# Patient Record
Sex: Female | Born: 1960 | Race: White | Hispanic: No | Marital: Married | Smoking: Former smoker
Health system: Southern US, Community
[De-identification: ages and names within clinical notes are randomized; demographics above are authoritative.]

## PROBLEM LIST (undated history)

## (undated) DIAGNOSIS — I10 Essential (primary) hypertension: Secondary | ICD-10-CM

## (undated) DIAGNOSIS — E119 Type 2 diabetes mellitus without complications: Secondary | ICD-10-CM

## (undated) HISTORY — PX: TONSILLECTOMY: SUR1361

## (undated) HISTORY — PX: FOOT SURGERY: SHX648

## (undated) HISTORY — PX: ABDOMINAL HYSTERECTOMY: SHX81

## (undated) HISTORY — PX: EYE SURGERY: SHX253

---

## 2008-09-22 ENCOUNTER — Ambulatory Visit (HOSPITAL_BASED_OUTPATIENT_CLINIC_OR_DEPARTMENT_OTHER): Admission: RE | Admit: 2008-09-22 | Discharge: 2008-09-22 | Payer: Self-pay | Admitting: Orthopedic Surgery

## 2011-03-27 NOTE — Op Note (Signed)
Christina Mccullough, Christina Mccullough               ACCOUNT NO.:  [id number]   MEDICAL RECORD NO.:  [id number]          PATIENT TYPE:  AMB   LOCATION:  DSC                          FACILITY:  [facility]   PHYSICIAN:  Leonides Grills, M.D.     DATE OF BIRTH:  [birth year]   DATE OF PROCEDURE:  09/22/2008  DATE OF DISCHARGE:                               OPERATIVE REPORT   PREOPERATIVE DIAGNOSES:  1. Left grade 1 posterior tibial tendonitis/insufficiency.  2. Left tight gastroc.   POSTOPERATIVE DIAGNOSES:  1. Left grade 1 posterior tibial tendonitis/insufficiency.  2. Left tight gastroc.   OPERATION:  1. Left gastroc slide.  2. Left posterior tibial tenosynovectomy.  3. Left FDL to navicular tendon transfer.   ANESTHESIA:  General.   SURGEON:  Leonides Grills, MD   ASSISTANT:  Richardean Canal, PA-C   ESTIMATED BLOOD LOSS:  Minimal.   TOURNIQUET TIME:  Approximately an hour.   COMPLICATIONS:  None.   DISPOSITION:  Stable to PR.   INDICATIONS:  This is a 50 year old pleasant female who has had  persistent posteromedial ankle pain secondary to post tibial tendonitis  in resistant to conservative management.  She was consented for the  above procedure.  All risks which include infection or vessel injury,  persistent pain, worsening pain, prolonged recovery were all explained  and questions were encouraged and answered.   OPERATION:  The patient was brought to the operating room and placed in  supine position after adequate general endotracheal anesthesia was  administered as well as Ancef 1 g IV piggyback.  She also had a left  ankle block.  Left lower extremity was then prepped and draped in  sterile manner over a proximally placed thigh tourniquet.  We started  the procedure with a longitudinal incision over the medial aspect of the  gastrocnemius muscle tendinous junction.  Dissection was carried down  through the skin.  Hemostasis was obtained.  Fascia was opened at the  line of the incision.   Conjoined region was then developed between the  gastroc-soleus musculature.  Soft tissue was elevated off the posterior  aspect gastrocnemius.  Sural nerve was identified and protected  posteriorly.  Gastrocnemius was then released with curved Mayo scissors.  This had an excellent release of tight gastroc.  The area was copiously  irrigated with normal saline.  Subcu was closed with 3-0 Vicryl, and the  skin was closed with 4-0 Monocryl, and Steri-Strips were applied.  Limb  was then gravity saline.  Tourniquet was elevated to 290 mmHg.  A  longitudinal incision over midline over the left posterior tibial tendon  was then made.  Dissection was carried down through the skin.  Hemostasis was obtained.  Retinaculum was opened over the posterior  tibial tendon carefully, not to injure the posterior tibial tendon.  There was a large amount of synovitis in this area and formal  tenosynovectomy was then performed.  FDL tendon was then identified and  traced to knot of Henry and then tenotomized.  We then created drill  hole with a 4.5 drill through the navicular tuberosity.  We then  placed  a Swanson suture passer, dorsal to plantar.  We then placed a 2-0  FiberWire fiber loop through the end of the FDL tendon and pulled this  using a Swanson suture passer from plantar to dorsal through the drill  hole.  We then used a free needle to sew the tendon to the posterior  tibial tendon insertion and periosteum locally and sutured the remaining  part of the tendon down distally with 2-0 FiberWire and stitch.  This  had an Conservation officer, historic buildings.  Tourniquet was deflated.  Hemostasis was  obtained.  Retinaculum was closed with 2-0 Vicryl.  Subcu was closed  with 4-0 Monocryl.  Skin was closed with 4-0 nylon.  Sterile dressing  was applied.  Modified Jones dressing was applied.  The ankle and knee  was dorsiflexioned.  The patient was stable to the PR.      Leonides Grills, M.D.  Electronically  Signed     PB/MEDQ  D:  09/22/2008  T:  09/22/2008  Job:  [id number]

## 2011-08-14 LAB — BASIC METABOLIC PANEL
CO2: 29
Calcium: 9
GFR calc Af Amer: >60
Sodium: 140

## 2011-08-14 LAB — POCT HEMOGLOBIN-HEMACUE: Hemoglobin: 11.6 — ABNORMAL LOW

## 2011-08-14 LAB — GLUCOSE, CAPILLARY: Glucose-Capillary: 116 — ABNORMAL HIGH

## 2012-12-15 ENCOUNTER — Emergency Department (HOSPITAL_BASED_OUTPATIENT_CLINIC_OR_DEPARTMENT_OTHER)
Admission: EM | Admit: 2012-12-15 | Discharge: 2012-12-15 | Disposition: A | Discharge status: Home / Self Care | Payer: No Typology Code available for payment source | Attending: Emergency Medicine | Admitting: Emergency Medicine

## 2012-12-15 ENCOUNTER — Emergency Department (HOSPITAL_BASED_OUTPATIENT_CLINIC_OR_DEPARTMENT_OTHER): Payer: No Typology Code available for payment source

## 2012-12-15 ENCOUNTER — Encounter (HOSPITAL_BASED_OUTPATIENT_CLINIC_OR_DEPARTMENT_OTHER): Payer: Self-pay | Admitting: *Deleted

## 2012-12-15 DIAGNOSIS — S4980XA Other specified injuries of shoulder and upper arm, unspecified arm, initial encounter: Secondary | ICD-10-CM | POA: Insufficient documentation

## 2012-12-15 DIAGNOSIS — Z791 Long term (current) use of non-steroidal anti-inflammatories (NSAID): Secondary | ICD-10-CM | POA: Insufficient documentation

## 2012-12-15 DIAGNOSIS — Z794 Long term (current) use of insulin: Secondary | ICD-10-CM | POA: Insufficient documentation

## 2012-12-15 DIAGNOSIS — S139XXA Sprain of joints and ligaments of unspecified parts of neck, initial encounter: Secondary | ICD-10-CM | POA: Insufficient documentation

## 2012-12-15 DIAGNOSIS — E119 Type 2 diabetes mellitus without complications: Secondary | ICD-10-CM | POA: Insufficient documentation

## 2012-12-15 DIAGNOSIS — S161XXA Strain of muscle, fascia and tendon at neck level, initial encounter: Secondary | ICD-10-CM

## 2012-12-15 DIAGNOSIS — I10 Essential (primary) hypertension: Secondary | ICD-10-CM | POA: Insufficient documentation

## 2012-12-15 DIAGNOSIS — F17200 Nicotine dependence, unspecified, uncomplicated: Secondary | ICD-10-CM | POA: Insufficient documentation

## 2012-12-15 DIAGNOSIS — Z79899 Other long term (current) drug therapy: Secondary | ICD-10-CM | POA: Insufficient documentation

## 2012-12-15 DIAGNOSIS — Y9389 Activity, other specified: Secondary | ICD-10-CM | POA: Insufficient documentation

## 2012-12-15 DIAGNOSIS — S46909A Unspecified injury of unspecified muscle, fascia and tendon at shoulder and upper arm level, unspecified arm, initial encounter: Secondary | ICD-10-CM | POA: Insufficient documentation

## 2012-12-15 DIAGNOSIS — Y92410 Unspecified street and highway as the place of occurrence of the external cause: Secondary | ICD-10-CM | POA: Insufficient documentation

## 2012-12-15 HISTORY — DX: Type 2 diabetes mellitus without complications: E11.9

## 2012-12-15 HISTORY — DX: Essential (primary) hypertension: I10

## 2012-12-15 MED ORDER — IBUPROFEN 800 MG PO TABS: 800.0000 mg | ORAL_TABLET | Freq: Three times a day (TID) | ORAL | Status: DC

## 2012-12-15 MED ORDER — HYDROCODONE-ACETAMINOPHEN 5-325 MG PO TABS: 2.0000 | ORAL_TABLET | ORAL | Status: AC | PRN
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
## 2012-12-16 NOTE — ED Provider Notes (Signed)
Medical screening examination/treatment/procedure(s) were performed by non-physician practitioner and as supervising physician I was immediately available for consultation/collaboration.    Zaiya Annunziato, MD 12/16/12 0714 

## 2013-01-07 ENCOUNTER — Emergency Department (HOSPITAL_BASED_OUTPATIENT_CLINIC_OR_DEPARTMENT_OTHER): Payer: No Typology Code available for payment source

## 2013-01-07 ENCOUNTER — Emergency Department (HOSPITAL_BASED_OUTPATIENT_CLINIC_OR_DEPARTMENT_OTHER)
Admission: EM | Admit: 2013-01-07 | Discharge: 2013-01-07 | Disposition: A | Discharge status: Home / Self Care | Payer: No Typology Code available for payment source | Attending: Emergency Medicine | Admitting: Emergency Medicine

## 2013-01-07 ENCOUNTER — Encounter (HOSPITAL_BASED_OUTPATIENT_CLINIC_OR_DEPARTMENT_OTHER): Payer: Self-pay | Admitting: *Deleted

## 2013-01-07 DIAGNOSIS — Z79899 Other long term (current) drug therapy: Secondary | ICD-10-CM | POA: Insufficient documentation

## 2013-01-07 DIAGNOSIS — Z791 Long term (current) use of non-steroidal anti-inflammatories (NSAID): Secondary | ICD-10-CM | POA: Insufficient documentation

## 2013-01-07 DIAGNOSIS — R071 Chest pain on breathing: Secondary | ICD-10-CM | POA: Insufficient documentation

## 2013-01-07 DIAGNOSIS — R0789 Other chest pain: Secondary | ICD-10-CM

## 2013-01-07 DIAGNOSIS — F17200 Nicotine dependence, unspecified, uncomplicated: Secondary | ICD-10-CM | POA: Insufficient documentation

## 2013-01-07 DIAGNOSIS — Z794 Long term (current) use of insulin: Secondary | ICD-10-CM | POA: Insufficient documentation

## 2013-01-07 DIAGNOSIS — E119 Type 2 diabetes mellitus without complications: Secondary | ICD-10-CM | POA: Insufficient documentation

## 2013-01-07 DIAGNOSIS — I10 Essential (primary) hypertension: Secondary | ICD-10-CM | POA: Insufficient documentation

## 2013-01-07 DIAGNOSIS — M542 Cervicalgia: Secondary | ICD-10-CM | POA: Insufficient documentation

## 2013-01-07 DIAGNOSIS — R209 Unspecified disturbances of skin sensation: Secondary | ICD-10-CM | POA: Insufficient documentation

## 2013-01-07 LAB — COMPREHENSIVE METABOLIC PANEL
ALT: 27 U/L (ref 0–35)
AST: 24 U/L (ref 0–37)
Albumin: 3.9 g/dL (ref 3.5–5.2)
Alkaline Phosphatase: 95 U/L (ref 39–117)
Chloride: 100 mEq/L (ref 96–112)
Potassium: 4.1 mEq/L (ref 3.5–5.1)
Sodium: 138 mEq/L (ref 135–145)
Total Bilirubin: 0.2 mg/dL — ABNORMAL LOW (ref 0.3–1.2)

## 2013-01-07 LAB — CBC WITH DIFFERENTIAL/PLATELET
Basophils Absolute: 0.1 10*3/uL (ref 0.0–0.1)
Basophils Relative: 1 % (ref 0–1)
Hemoglobin: 11.5 g/dL — ABNORMAL LOW (ref 12.0–15.0)
MCHC: 32.5 g/dL (ref 30.0–36.0)
Monocytes Relative: 7 % (ref 3–12)
Neutro Abs: 4.3 10*3/uL (ref 1.7–7.7)
Neutrophils Relative %: 54 % (ref 43–77)
Platelets: 282 10*3/uL (ref 150–400)
RDW: 13.8 % (ref 11.5–15.5)

## 2013-01-07 MED ORDER — ASPIRIN 81 MG PO CHEW: 324.0000 mg | CHEWABLE_TABLET | Freq: Once | ORAL | Status: DC

## 2013-01-07 MED ORDER — OXYCODONE-ACETAMINOPHEN 5-325 MG PO TABS
2.0000 | ORAL_TABLET | Freq: Once | ORAL | Status: AC
  Administered 2013-01-07: 2 via ORAL
  Filled 2013-01-07 (×2): qty 2

## 2013-01-07 MED ORDER — ASPIRIN 81 MG PO CHEW
324.0000 mg | CHEWABLE_TABLET | Freq: Once | ORAL | Status: AC
  Administered 2013-01-07: 324 mg via ORAL
  Filled 2013-01-07: qty 4

## 2013-01-07 MED ORDER — OXYCODONE-ACETAMINOPHEN 5-325 MG PO TABS: 1.0000 | ORAL_TABLET | ORAL | Status: DC | PRN
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
## 2013-01-07 NOTE — ED Notes (Signed)
MD at bedside. 
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
## 2015-11-30 ENCOUNTER — Emergency Department (HOSPITAL_COMMUNITY)
Admission: EM | Admit: 2015-11-30 | Discharge: 2015-11-30 | Disposition: A | Discharge status: Home / Self Care | Payer: Worker's Compensation | Attending: Emergency Medicine | Admitting: Emergency Medicine

## 2015-11-30 ENCOUNTER — Encounter (HOSPITAL_COMMUNITY): Payer: Self-pay

## 2015-11-30 ENCOUNTER — Emergency Department (HOSPITAL_COMMUNITY): Payer: Worker's Compensation

## 2015-11-30 DIAGNOSIS — S99912A Unspecified injury of left ankle, initial encounter: Secondary | ICD-10-CM | POA: Diagnosis present

## 2015-11-30 DIAGNOSIS — W010XXA Fall on same level from slipping, tripping and stumbling without subsequent striking against object, initial encounter: Secondary | ICD-10-CM | POA: Insufficient documentation

## 2015-11-30 DIAGNOSIS — Z87891 Personal history of nicotine dependence: Secondary | ICD-10-CM | POA: Insufficient documentation

## 2015-11-30 DIAGNOSIS — E119 Type 2 diabetes mellitus without complications: Secondary | ICD-10-CM | POA: Diagnosis not present

## 2015-11-30 DIAGNOSIS — Z79899 Other long term (current) drug therapy: Secondary | ICD-10-CM | POA: Diagnosis not present

## 2015-11-30 DIAGNOSIS — S82142A Displaced bicondylar fracture of left tibia, initial encounter for closed fracture: Secondary | ICD-10-CM

## 2015-11-30 DIAGNOSIS — S8992XA Unspecified injury of left lower leg, initial encounter: Secondary | ICD-10-CM | POA: Diagnosis not present

## 2015-11-30 DIAGNOSIS — S82832A Other fracture of upper and lower end of left fibula, initial encounter for closed fracture: Secondary | ICD-10-CM | POA: Diagnosis not present

## 2015-11-30 DIAGNOSIS — Y92002 Bathroom of unspecified non-institutional (private) residence single-family (private) house as the place of occurrence of the external cause: Secondary | ICD-10-CM | POA: Diagnosis not present

## 2015-11-30 DIAGNOSIS — Z794 Long term (current) use of insulin: Secondary | ICD-10-CM | POA: Insufficient documentation

## 2015-11-30 DIAGNOSIS — I10 Essential (primary) hypertension: Secondary | ICD-10-CM | POA: Diagnosis not present

## 2015-11-30 DIAGNOSIS — Y9389 Activity, other specified: Secondary | ICD-10-CM | POA: Diagnosis not present

## 2015-11-30 DIAGNOSIS — Y998 Other external cause status: Secondary | ICD-10-CM | POA: Diagnosis not present

## 2015-11-30 DIAGNOSIS — S82402A Unspecified fracture of shaft of left fibula, initial encounter for closed fracture: Secondary | ICD-10-CM

## 2015-11-30 MED ORDER — ONDANSETRON HCL 4 MG PO TABS: 4.0000 mg | ORAL_TABLET | Freq: Four times a day (QID) | ORAL | Status: DC

## 2015-11-30 MED ORDER — PROMETHAZINE HCL 25 MG PO TABS: 25.0000 mg | ORAL_TABLET | Freq: Four times a day (QID) | ORAL | Status: AC | PRN

## 2015-11-30 MED ORDER — HYDROMORPHONE HCL 1 MG/ML IJ SOLN
0.5000 mg | Freq: Once | INTRAMUSCULAR | Status: AC
  Administered 2015-11-30: 0.5 mg via INTRAVENOUS
  Filled 2015-11-30: qty 1

## 2015-11-30 MED ORDER — OXYCODONE-ACETAMINOPHEN 5-325 MG PO TABS
1.0000 | ORAL_TABLET | Freq: Once | ORAL | Status: AC
  Administered 2015-11-30: 1 via ORAL
  Filled 2015-11-30: qty 1

## 2015-11-30 MED ORDER — OXYCODONE-ACETAMINOPHEN 5-325 MG PO TABS: 1.0000 | ORAL_TABLET | ORAL | Status: AC | PRN

## 2015-11-30 NOTE — ED Notes (Signed)
Patient transported to X-ray 

## 2015-11-30 NOTE — Discharge Instructions (Signed)
KEEP THE LEFT LEG ELEVATED AS MUCH AS POSSIBLE - THIS MEANS PROP THE LEG UP ON PILLOWS AND RECLINE SO THAT THE LEG IS ABOVE HEART LEVEL. ICE AS INSTRUCTED TO REDUCE SWELLING. FOLLOW UP WITH DR. Veda Canning AS PLANNED AND TAKE MEDICATION FOR PAIN AS NEEDED, WITH ZOFRAN FOR ANY NAUSEA. IT IS RECOMMENDED THAT YOU USE A STOOL SOFTENER WHILE TAKING PERCOCET AS NARCOTIC PAIN RELIEVERS CAN CAUSE CONSTIPATION. RETURN TO THE EMERGENCY DEPARTMENT WITH ANY NEW INJURY, OR NEW CONCERN.  Cast or Splint Care Casts and splints support injured limbs and keep bones from moving while they heal. It is important to care for your cast or splint at home.  HOME CARE INSTRUCTIONS  Keep the cast or splint uncovered during the drying period. It can take 24 to 48 hours to dry if it is made of plaster. A fiberglass cast will dry in less than 1 hour.  Do not rest the cast on anything harder than a pillow for the first 24 hours.  Do not put weight on your injured limb or apply pressure to the cast until your health care provider gives you permission.  Keep the cast or splint dry. Wet casts or splints can lose their shape and may not support the limb as well. A wet cast that has lost its shape can also create harmful pressure on your skin when it dries. Also, wet skin can become infected.  Cover the cast or splint with a plastic bag when bathing or when out in the rain or snow. If the cast is on the trunk of the body, take sponge baths until the cast is removed.  If your cast does become wet, dry it with a towel or a blow dryer on the cool setting only.  Keep your cast or splint clean. Soiled casts may be wiped with a moistened cloth.  Do not place any hard or soft foreign objects under your cast or splint, such as cotton, toilet paper, lotion, or powder.  Do not try to scratch the skin under the cast with any object. The object could get stuck inside the cast. Also, scratching could lead to an infection. If itching is a  problem, use a blow dryer on a cool setting to relieve discomfort.  Do not trim or cut your cast or remove padding from inside of it.  Exercise all joints next to the injury that are not immobilized by the cast or splint. For example, if you have a long leg cast, exercise the hip joint and toes. If you have an arm cast or splint, exercise the shoulder, elbow, thumb, and fingers.  Elevate your injured arm or leg on 1 or 2 pillows for the first 1 to 3 days to decrease swelling and pain.It is best if you can comfortably elevate your cast so it is higher than your heart. SEEK MEDICAL CARE IF:   Your cast or splint cracks.  Your cast or splint is too tight or too loose.  You have unbearable itching inside the cast.  Your cast becomes wet or develops a soft spot or area.  You have a bad smell coming from inside your cast.  You get an object stuck under your cast.  Your skin around the cast becomes red or raw.  You have new pain or worsening pain after the cast has been applied. SEEK IMMEDIATE MEDICAL CARE IF:   You have fluid leaking through the cast.  You are unable to move your fingers or toes.  You have discolored (blue or white), cool, painful, or very swollen fingers or toes beyond the cast.  You have tingling or numbness around the injured area.  You have severe pain or pressure under the cast.  You have any difficulty with your breathing or have shortness of breath.  You have chest pain.   This information is not intended to replace advice given to you by your health care provider. Make sure you discuss any questions you have with your health care provider.   Document Released: 10/26/2000 Document Revised: 08/19/2013 Document Reviewed: 05/07/2013 Elsevier Interactive Patient Education 2016 [employer]  Fibular Ankle Fracture Treated With or Without Immobilization, Adult A fibular fracture at your ankle is a break (fracture) bone in the smallest of the two bones in  your lower leg, located on the outside of your leg (fibula) close to the area at your ankle joint. CAUSES  Rolling your ankle.  Twisting your ankle.  Extreme flexing or extending of your foot.  Severe force on your ankle as when falling from a distance. RISK FACTORS  Jumping activities.  Participation in sports.  Osteoporosis.  Advanced age.  Previous ankle injuries. SIGNS AND SYMPTOMS  Pain.  Swelling.  Inability to put weight on injured ankle.  Bruising.  Bone deformities at site of injury. DIAGNOSIS  This fracture is diagnosed with the help of an X-ray exam. TREATMENT  If the fractured bone did not move out of place it usually will heal without problems and does casting or splinting. If immobilization is needed for comfort or the fractured bone moved out of place and will not heal properly with immobilization, a cast or splint will be used. HOME CARE INSTRUCTIONS   Apply ice to the area of injury:  Put ice in a plastic bag.  Place a towel between your skin and the bag.  Leave the ice on for 20 minutes, 2-3 times a day.  Use crutches as directed. Resume walking without crutches as directed by your health care provider.  Only take over-the-counter or prescription medicines for pain, discomfort, or fever as directed by your health care provider.  If you have a removable splint or boot, do not remove the boot unless directed by your health care provider. SEEK MEDICAL CARE IF:   You have continued pain or more swelling  The medications do not control the pain. SEEK IMMEDIATE MEDICAL CARE IF:  You develop severe pain in the leg or foot.  Your skin or nails below the injury turn blue or grey or feel cold or numb. MAKE SURE YOU:   Understand these instructions.  Will watch your condition.  Will get help right away if you are not doing well or get worse.   This information is not intended to replace advice given to you by your health care provider. Make  sure you discuss any questions you have with your health care provider.   Document Released: 10/29/2005 Document Revised: 11/19/2014 Document Reviewed: 06/10/2013 Elsevier Interactive Patient Education 2016 [employer] Nondisplaced Tibial Plateau Fracture A tibial plateau fracture is a break in the bone that forms the bottom of your knee joint (tibia or shin bone). The lower end of your thigh bone (femur) forms the upper surface of your knee joint. The top of the tibia has a flat, smooth surface (tibial plateau). This part of the shin bone is made of softer bone than the shaft of the shin bone. If a strong force shoves your femur down into your tibial plateau,  the tibial plateau can collapse or break away at the edges. This is also called an intra-articular fracture. A nondisplaced fracture means that the broken piece or pieces of your tibial plateau have not moved out of their normal position. This type of fracture can usually be treated without surgery. You will need to wear a brace or a splint and avoid using your knee to support your body weight while the fracture is healing. CAUSES Common causes of this type of fracture include:  Car accidents.  Jumps or falls from a significant height.  Injuries from high-energy sports or contact sports. RISK FACTORS You may be at higher risk for this type of fracture if:  You play high-energy sports or contact sports.  You have a history of bone infections.  You are an older person with a condition that causes weak bones (osteoporosis). SIGNS AND SYMPTOMS Signs and symptoms of a nondisplaced tibial plateau fracture begin immediately after the injury. They may include:  Pain that is worse when you use your knee to support your body weight.  Swelling of your knee.  Bruising around your knee. DIAGNOSIS Your health care provider may suspect a nondisplaced tibial plateau fracture based on your signs and symptoms, especially if you had a recent  injury. Your health care provider will also do a physical exam. This may include imaging tests such as:  X-ray of your knee. This is used to confirm the diagnosis.  CT scan or MRI. This checks to make sure that no bones have moved out of place and that there are no other injuries to your knee. TREATMENT Treatment for a nondisplaced tibial plateau fracture may involve wearing a brace or a splint to keep your leg in one position while it heals. You may also need to use crutches, a scooter, a walker, or a wheelchair so you can move around without using your injured leg to support your body weight. You may also be prescribed pain medicine. While your knee is healing, you may see a physical therapist. Your physical therapist will move your knee without putting weight on it (passive exercise). This helps to keep your knee from becoming stiff. After your leg has healed, your health care provider will show you how to do range-of-motion exercises to strengthen your knee muscles and prevent stiffness. HOME CARE INSTRUCTIONS If You Have a Brace or a Splint:  Wear it as directed by your health care provider. Remove it only as directed by your health care provider.  Loosen the brace or splint if your toes become numb and tingle, or if they turn cold and blue. Bathing  Cover the brace or splint with a watertight plastic bag to protect it from water while you bathe or shower. Do not let the brace or splint get wet. Managing Pain, Stiffness, and Swelling  If directed, apply ice to the injured area:  Put ice in a plastic bag.  Place a towel between your skin and the bag.  Leave the ice on for 20 minutes, 2-3 times a day.  Move your toes and ankle often to avoid stiffness and to lessen swelling.  Raise the injured area above the level of your heart while you are sitting or lying down. Driving  Do not drive or operate heavy machinery while taking pain medicine.  Do not drive while wearing a brace or a  splint on a leg that you use for driving. Activity  Return to your normal activities as directed by your health care provider.  Ask your health care provider what activities are safe for you.  Perform range-of-motion exercises only as directed by your health care provider. Safety  Do not use the injured limb to support your body weight until your health care provider says that you can. Use crutches, a scooter, a walker, or a wheelchair as directed by your health care provider. General Instructions  Keep the brace or splint clean and dry.  Do not use any tobacco products, including cigarettes, chewing tobacco, or electronic cigarettes. Tobacco can delay bone healing. If you need help quitting, ask your health care provider.  Take medicines only as directed by your health care provider.  Keep all follow-up visits as directed by your health care provider. This is important. SEEK MEDICAL CARE IF:  Your pain is getting worse.  Your pain medicine is not helping. SEEK IMMEDIATE MEDICAL CARE IF:  You have very bad pain in your injured leg.  You have swelling or redness in your foot that is getting worse.  You begin to lose feeling in your foot or your toes.  Your foot or your toes are cold or turn blue.   This information is not intended to replace advice given to you by your health care provider. Make sure you discuss any questions you have with your health care provider.   Document Released: 08/08/2005 Document Revised: 11/19/2014 Document Reviewed: 06/16/2014 Elsevier Interactive Patient Education 2016 [employer] Inc. Cryotherapy Cryotherapy means treatment with cold. Ice or gel packs can be used to reduce both pain and swelling. Ice is the most helpful within the first 24 to 48 hours after an injury or flare-up from overusing a muscle or joint. Sprains, strains, spasms, burning pain, shooting pain, and aches can all be eased with ice. Ice can also be used when recovering from surgery.  Ice is effective, has very few side effects, and is safe for most people to use. PRECAUTIONS  Ice is not a safe treatment option for people with:  Raynaud phenomenon. This is a condition affecting small blood vessels in the extremities. Exposure to cold may cause your problems to return.  Cold hypersensitivity. There are many forms of cold hypersensitivity, including:  Cold urticaria. Red, itchy hives appear on the skin when the tissues begin to warm after being iced.  Cold erythema. This is a red, itchy rash caused by exposure to cold.  Cold hemoglobinuria. Red blood cells break down when the tissues begin to warm after being iced. The hemoglobin that carry oxygen are passed into the urine because they cannot combine with blood proteins fast enough.  Numbness or altered sensitivity in the area being iced. If you have any of the following conditions, do not use ice until you have discussed cryotherapy with your caregiver:  Heart conditions, such as arrhythmia, angina, or chronic heart disease.  High blood pressure.  Healing wounds or open skin in the area being iced.  Current infections.  Rheumatoid arthritis.  Poor circulation.  Diabetes. Ice slows the blood flow in the region it is applied. This is beneficial when trying to stop inflamed tissues from spreading irritating chemicals to surrounding tissues. However, if you expose your skin to cold temperatures for too long or without the proper protection, you can damage your skin or nerves. Watch for signs of skin damage due to cold. HOME CARE INSTRUCTIONS Follow these tips to use ice and cold packs safely.  Place a dry or damp towel between the ice and skin. A damp towel will cool the  skin more quickly, so you may need to shorten the time that the ice is used.  For a more rapid response, add gentle compression to the ice.  Ice for no more than 10 to 20 minutes at a time. The bonier the area you are icing, the less time it will  take to get the benefits of ice.  Check your skin after 5 minutes to make sure there are no signs of a poor response to cold or skin damage.  Rest 20 minutes or more between uses.  Once your skin is numb, you can end your treatment. You can test numbness by very lightly touching your skin. The touch should be so light that you do not see the skin dimple from the pressure of your fingertip. When using ice, most people will feel these normal sensations in this order: cold, burning, aching, and numbness.  Do not use ice on someone who cannot communicate their responses to pain, such as small children or people with dementia. HOW TO MAKE AN ICE PACK Ice packs are the most common way to use ice therapy. Other methods include ice massage, ice baths, and cryosprays. Muscle creams that cause a cold, tingly feeling do not offer the same benefits that ice offers and should not be used as a substitute unless recommended by your caregiver. To make an ice pack, do one of the following:  Place crushed ice or a bag of frozen vegetables in a sealable plastic bag. Squeeze out the excess air. Place this bag inside another plastic bag. Slide the bag into a pillowcase or place a damp towel between your skin and the bag.  Mix 3 parts water with 1 part rubbing alcohol. Freeze the mixture in a sealable plastic bag. When you remove the mixture from the freezer, it will be slushy. Squeeze out the excess air. Place this bag inside another plastic bag. Slide the bag into a pillowcase or place a damp towel between your skin and the bag. SEEK MEDICAL CARE IF:  You develop white spots on your skin. This may give the skin a blotchy (mottled) appearance.  Your skin turns blue or pale.  Your skin becomes waxy or hard.  Your swelling gets worse. MAKE SURE YOU:   Understand these instructions.  Will watch your condition.  Will get help right away if you are not doing well or get worse.   This information is not  intended to replace advice given to you by your health care provider. Make sure you discuss any questions you have with your health care provider.   Document Released: 06/25/2011 Document Revised: 11/19/2014 Document Reviewed: 06/25/2011 Elsevier Interactive Patient Education [employer]

## 2015-11-30 NOTE — ED Notes (Addendum)
PT RETURNED FROM CT. PT PLACED ON A STRETCHER FOR COMFORT.

## 2015-11-30 NOTE — ED Notes (Signed)
PT DISCHARGED. INSTRUCTIONS AND PRESCRIPTIONS GIVEN. AAOX3. PT IN NO APPARENT DISTRESS. THE OPPORTUNITY TO ASK QUESTIONS WAS PROVIDED. [facility]AR ARRIVED TO TRANSPORT PT HOME.

## 2015-11-30 NOTE — ED Notes (Signed)
Patient transported to CT 

## 2015-11-30 NOTE — ED Notes (Signed)
ORTHO TECH AT THE BEDSIDE TO APPLY THE SPLINT. PT TOLERATING WELL.

## 2015-11-30 NOTE — ED Provider Notes (Signed)
The patient was signed out at end of shift by Wynetta Emery, PA-C, with CT pending to evaluate left tibial plateau fracture. Patient has a mechanical fall, falling onto the left leg causing oblique fibula fracture and knee injury. Imaging support slightly depressed tibial plateau fx and fibula fracture, both closed injuries.  Discussed with Dr. Veda Canning of [facility] orthopedics who reviews the films and advises outpatient management. Patient placed in posterior stirrup splint (fibular fx) and knee immobilizer. She is strongly advised to remain non-weight bearing, use ice as instructed and elevate the leg, describing definition of "elevation". She will need to call the office to schedule a recheck appointment for next week with Dr. Veda Canning.   Elpidio Anis, PA-C 11/30/15 2231  Arby Barrette, MD 11/30/15 2350

## 2015-11-30 NOTE — ED Provider Notes (Signed)
CSN: [id number]     Arrival date & time 11/30/15  1653 History  By signing my name below, I, Christina Mccullough, attest that this documentation has been prepared under the direction and in the presence of  [name], PA-C. Electronically Signed: Marica Mccullough, ED Scribe. 11/30/2015. 5:20 PM.  Chief Complaint  Patient presents with  . Ankle Pain    LEFT KNEE AND ANKLE   The history is provided by the patient. No language interpreter was used.   PCP: No primary care provider on file. HPI Comments: Christina Mccullough is a 55 y.o. female, with PMHx noted below including left posterior tibial tenosynovectomy and left FDL to navicular tendon transfer, who presents to the Emergency Department via ambulance complaining of a fall after pt slipped on her bathroom floor and fell earlier today. Pt denies head trauma or LOC. Associated Sx include severe left lower leg pain, left ankle pain and inability to walk secondary to pain. Pt denies chest pain immediately preceding the fall or any other Sx at this time.  Pt reports she feels itchy when she takes morphine and denies any other medicinal allergies.   Past Medical History  Diagnosis Date  . Hypertension   . Diabetes mellitus without complication [facility])    Past Surgical History  Procedure Laterality Date  . Tonsillectomy    . Abdominal hysterectomy    . Foot surgery    . Eye surgery     History reviewed. No pertinent family history. Social History  Substance Use Topics  . Smoking status: Former Smoker    Types: Cigarettes  . Smokeless tobacco: None  . Alcohol Use: Yes     Comment: 0CCASIONAL   OB History    No data available     Review of Systems A complete 10 system review of systems was obtained and all systems are negative except as noted in the HPI and PMH.   Allergies  Morphine and related  Home Medications   Prior to Admission medications   Medication Sig Start Date End Date Taking? Authorizing Provider  Celecoxib (CELEBREX  PO) Take by mouth.    Historical Provider, MD  Esomeprazole Magnesium (NEXIUM PO) Take by mouth.    Historical Provider, MD  ibuprofen (ADVIL,MOTRIN) 800 MG tablet Take 1 tablet (800 mg total) by mouth 3 (three) times daily. 12/15/12   Elson Areas, PA-C  insulin glargine (LANTUS) 100 UNIT/ML injection Inject into the skin at bedtime.    Historical Provider, MD  Montelukast Sodium (SINGULAIR PO) Take by mouth.    Historical Provider, MD  oxyCODONE-acetaminophen (PERCOCET/ROXICET) 5-325 MG per tablet Take 1 tablet by mouth every 4 (four) hours as needed for pain. 01/07/13   Margarita Grizzle, MD  Rosuvastatin Calcium (CRESTOR PO) Take by mouth.    Historical Provider, MD  SitaGLIPtin-MetFORMIN HCl (JANUMET PO) Take by mouth.    Historical Provider, MD  valsartan (DIOVAN) 160 MG tablet Take 160 mg by mouth daily.    Historical Provider, MD   Triage Vitals: BP 153/81 mmHg  Pulse 78  Temp(Src) 98.4 F (36.9 C) (Oral)  Resp 20  Ht  (1.676 m)  Wt 270 lb (122.471 kg)  BMI 43.60 kg/m2  SpO2 98% Physical Exam  Constitutional: She is oriented to person, place, and time. She appears well-developed and well-nourished.  HENT:  Head: Normocephalic.  Eyes: EOM are normal.  Neck: Normal range of motion.  Pulmonary/Chest: Effort normal.  Abdominal: She exhibits no distension.  Musculoskeletal: Normal range of motion.  She exhibits tenderness.  No deformity to foot ankle and knee. Patient is diffusely tender to palpation from knee to foot, no focal bony tenderness elicited. She is also tender palpation along the left greater trochanter. Neurovascularly intact, reduced range of motion to knee, ankle and toes.  Neurological: She is alert and oriented to person, place, and time.  Psychiatric: She has a normal mood and affect.  Nursing note and vitals reviewed.   ED Course  Procedures (including critical care time) DIAGNOSTIC STUDIES: Oxygen Saturation is 98% on ra, nl by my interpretation.     COORDINATION OF CARE: 5:15 PM: Discussed treatment plan which includes imaging with pt at bedside; patient verbalizes understanding and agrees with treatment plan.  Imaging Review No results found. I have personally reviewed and evaluated these images as part of my medical decision-making.   MDM   Final diagnoses:  None    Filed Vitals:   11/30/15 1711 11/30/15 1714 11/30/15 1716 11/30/15 1951  BP:   153/81 120/64  Pulse:   78 67  Temp:   98.4 F (36.9 C) 97.6 F (36.4 C)  TempSrc:   Oral Oral  Resp:   20 20  Height:   (1.676 m)    Weight:  122.471 kg    SpO2: 96%  98% 95%    Medications  oxyCODONE-acetaminophen (PERCOCET/ROXICET) 5-325 MG per tablet 1 tablet (1 tablet Oral Given 11/30/15 1724)  HYDROmorphone (DILAUDID) injection 0.5 mg (0.5 mg Intravenous Given 11/30/15 1947)    Christina Mccullough is 55 y.o. female presenting with severe diffuse tenderness to palpation of the left knee and ankle status post mechanical fall. No overt deformity, neurovascularly intact. X-ray shows an oblique fracture of the distal fibula with displacement also nondisplaced lateral tibial plateau fracture. Will obtain CT to further define the tibial plateau fracture   Case signed out to PA Upstill at shift change: Plan is to follow-up CT and discussed with orthopedist  I personally performed the services described in this documentation, which was scribed in my presence. The recorded information has been reviewed and is accurate. Wynetta Emery, PA-C 11/30/15 2037  Arby Barrette, MD 11/30/15 2059

## 2015-11-30 NOTE — ED Notes (Addendum)
PT RECEIVED VIA EMS C/O LEFT KNEE AND ANKLE PAIN DUE TO A SLIP AND FALL IN THE BATHROOM AT WORK. DENIES HEAD INJURY OR LOC.

## 2016-09-18 IMAGING — CR DG KNEE COMPLETE 4+V*L*
4 series · 4 of 4 positions shown · non-contrast
Comparison: No priors.

CLINICAL DATA: 54-year-old female with history of trauma from a
fall in the bathroom earlier today complaining of left hip and left
anterior knee pain.

EXAM:
LEFT KNEE - COMPLETE 4+ VIEW

[t knee ap left]
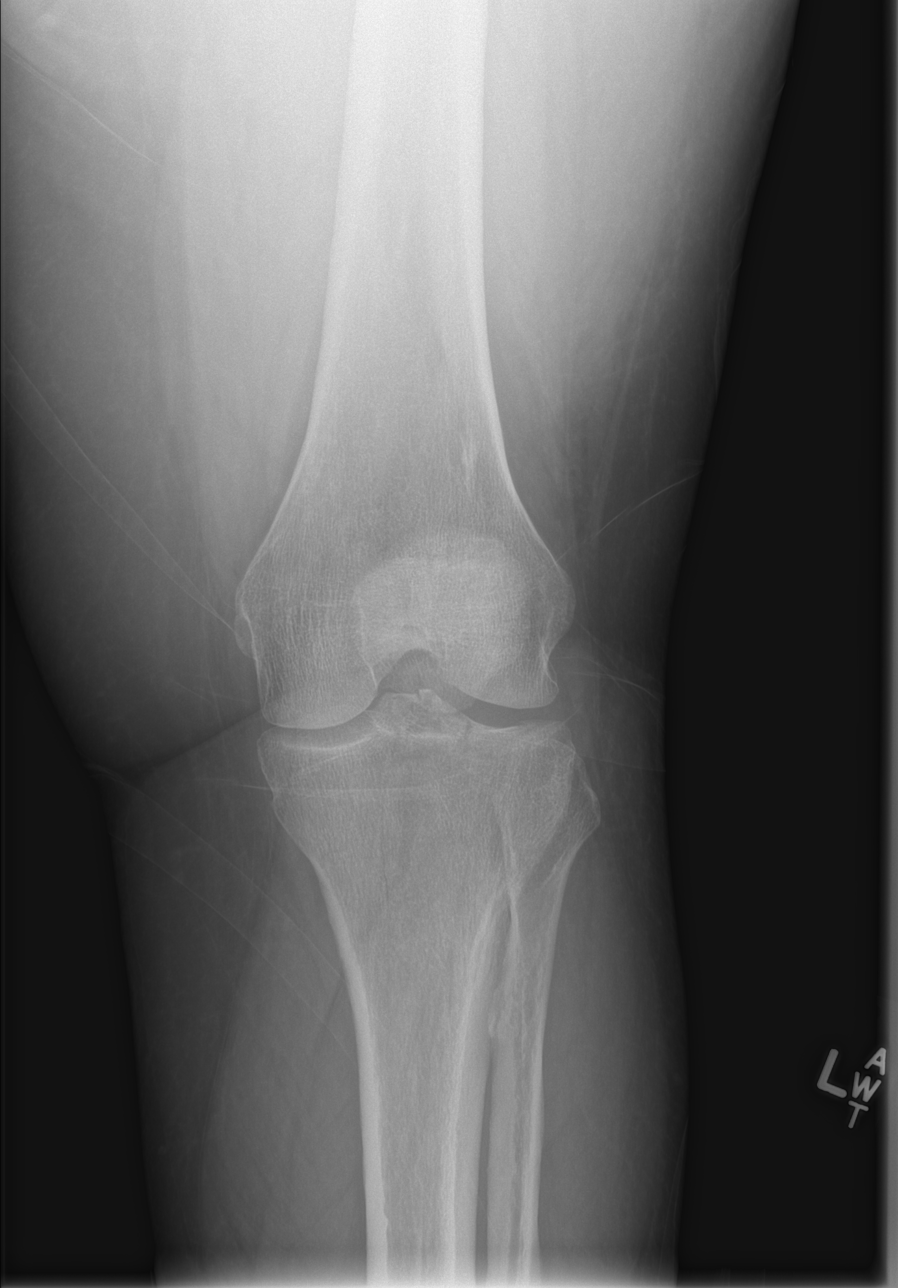

[t knee obl left (1 of 2)]
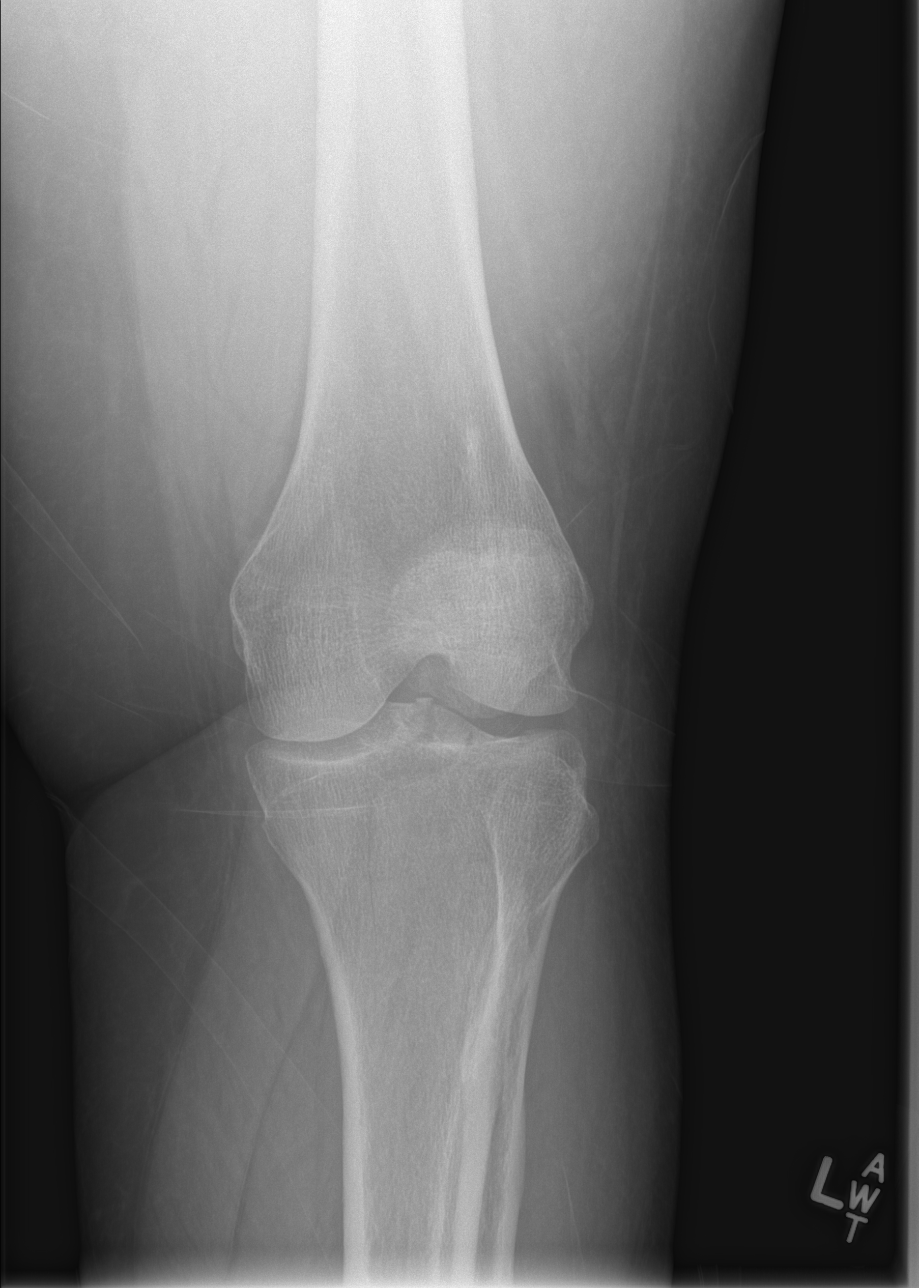

[t knee obl left (2 of 2)]
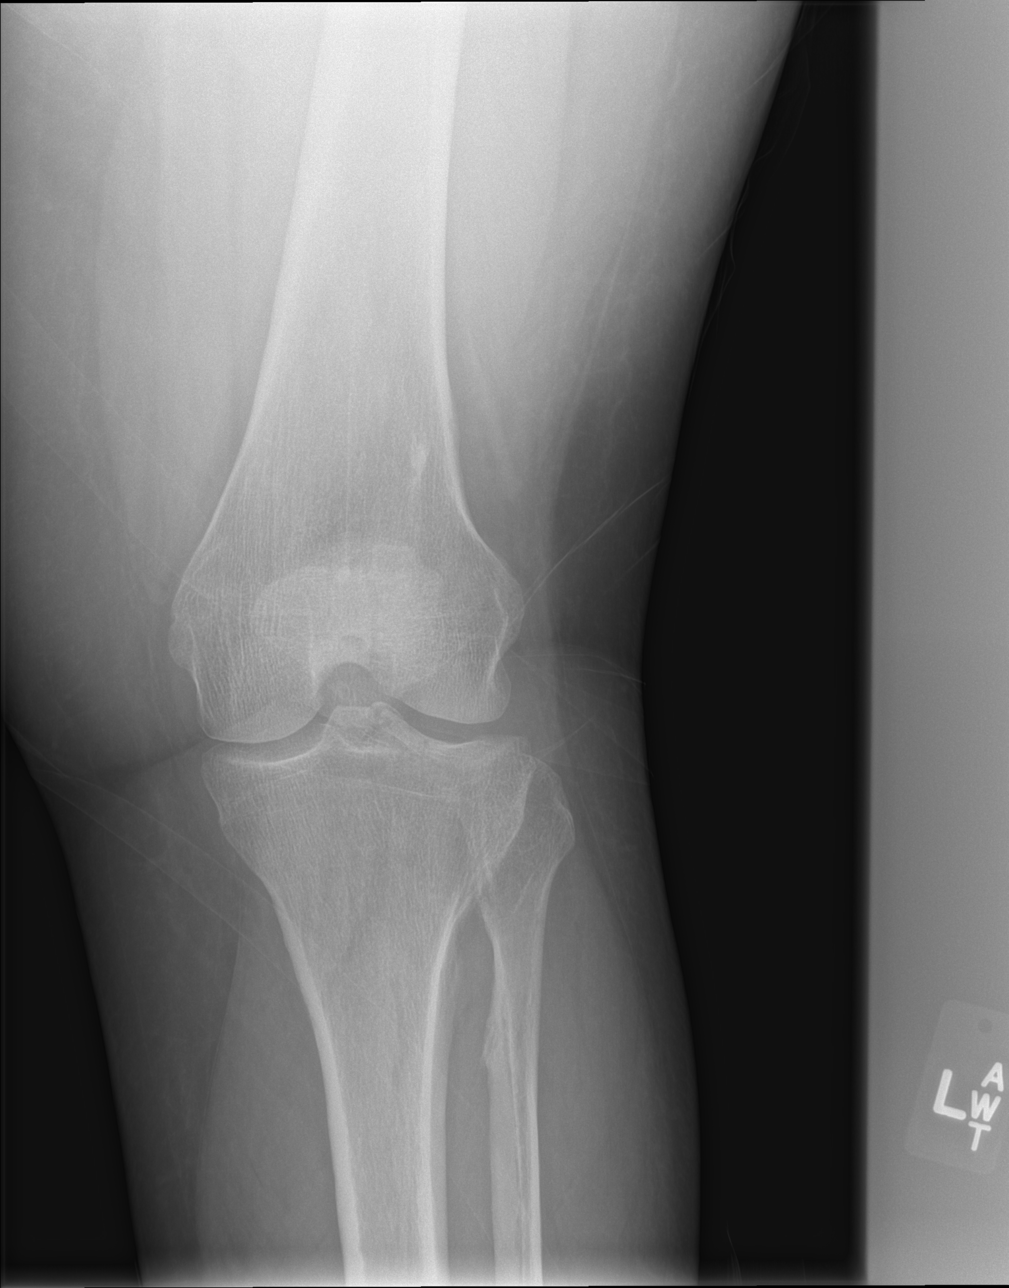

[x knee lat left]
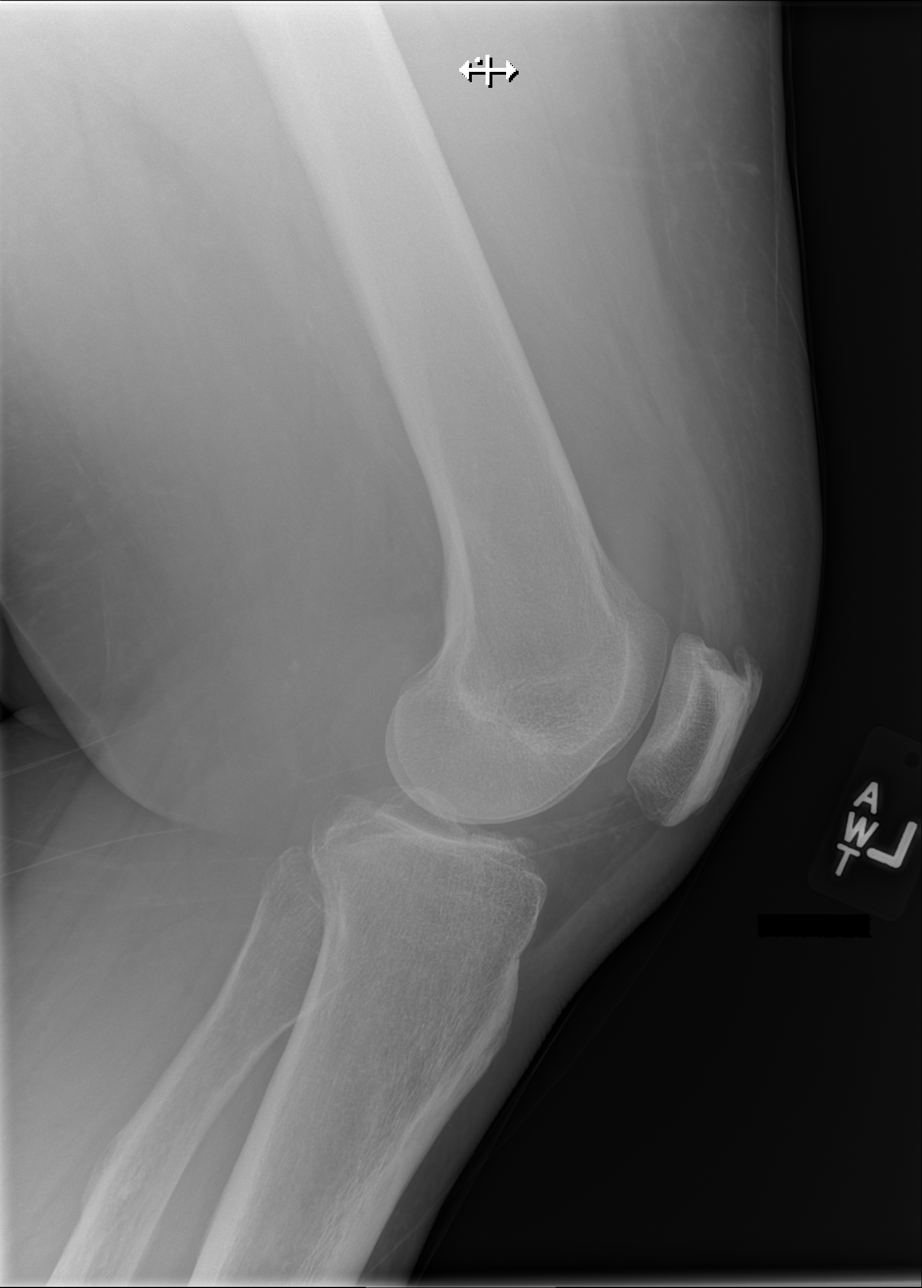

[4 of 4 positions shown; findings below may reference images not displayed]

FINDINGS: Four views of the left knee demonstrate lucencies extending through
the articular surface of the lateral tibial plateau, as well as the
lateral aspect of the intercondylar notch. These lucencies appear
potentially contiguous with some disrupted trabeculae in the
proximal tibial metaphysis and proximal third of the tibial
diaphysis. Distal femur and patella appear intact. Proximal fibula
is intact. Small lipohemarthrosis.
IMPRESSION: 1. Nondisplaced intra-articular fracture of the lateral tibial
plateau and intercondylar notch, with possible extension into the
proximal tibial metaphysis and proximal diaphysis. This could be
better characterized by followup noncontrast CT of the left knee.

## 2016-09-18 IMAGING — CR DG ANKLE COMPLETE 3+V*L*
3 series · 3 of 3 positions shown · non-contrast
Comparison: None.

CLINICAL DATA: Fall with left ankle pain and injury. Initial
encounter.

EXAM:
LEFT ANKLE COMPLETE - 3+ VIEW

[x ankle lat left]
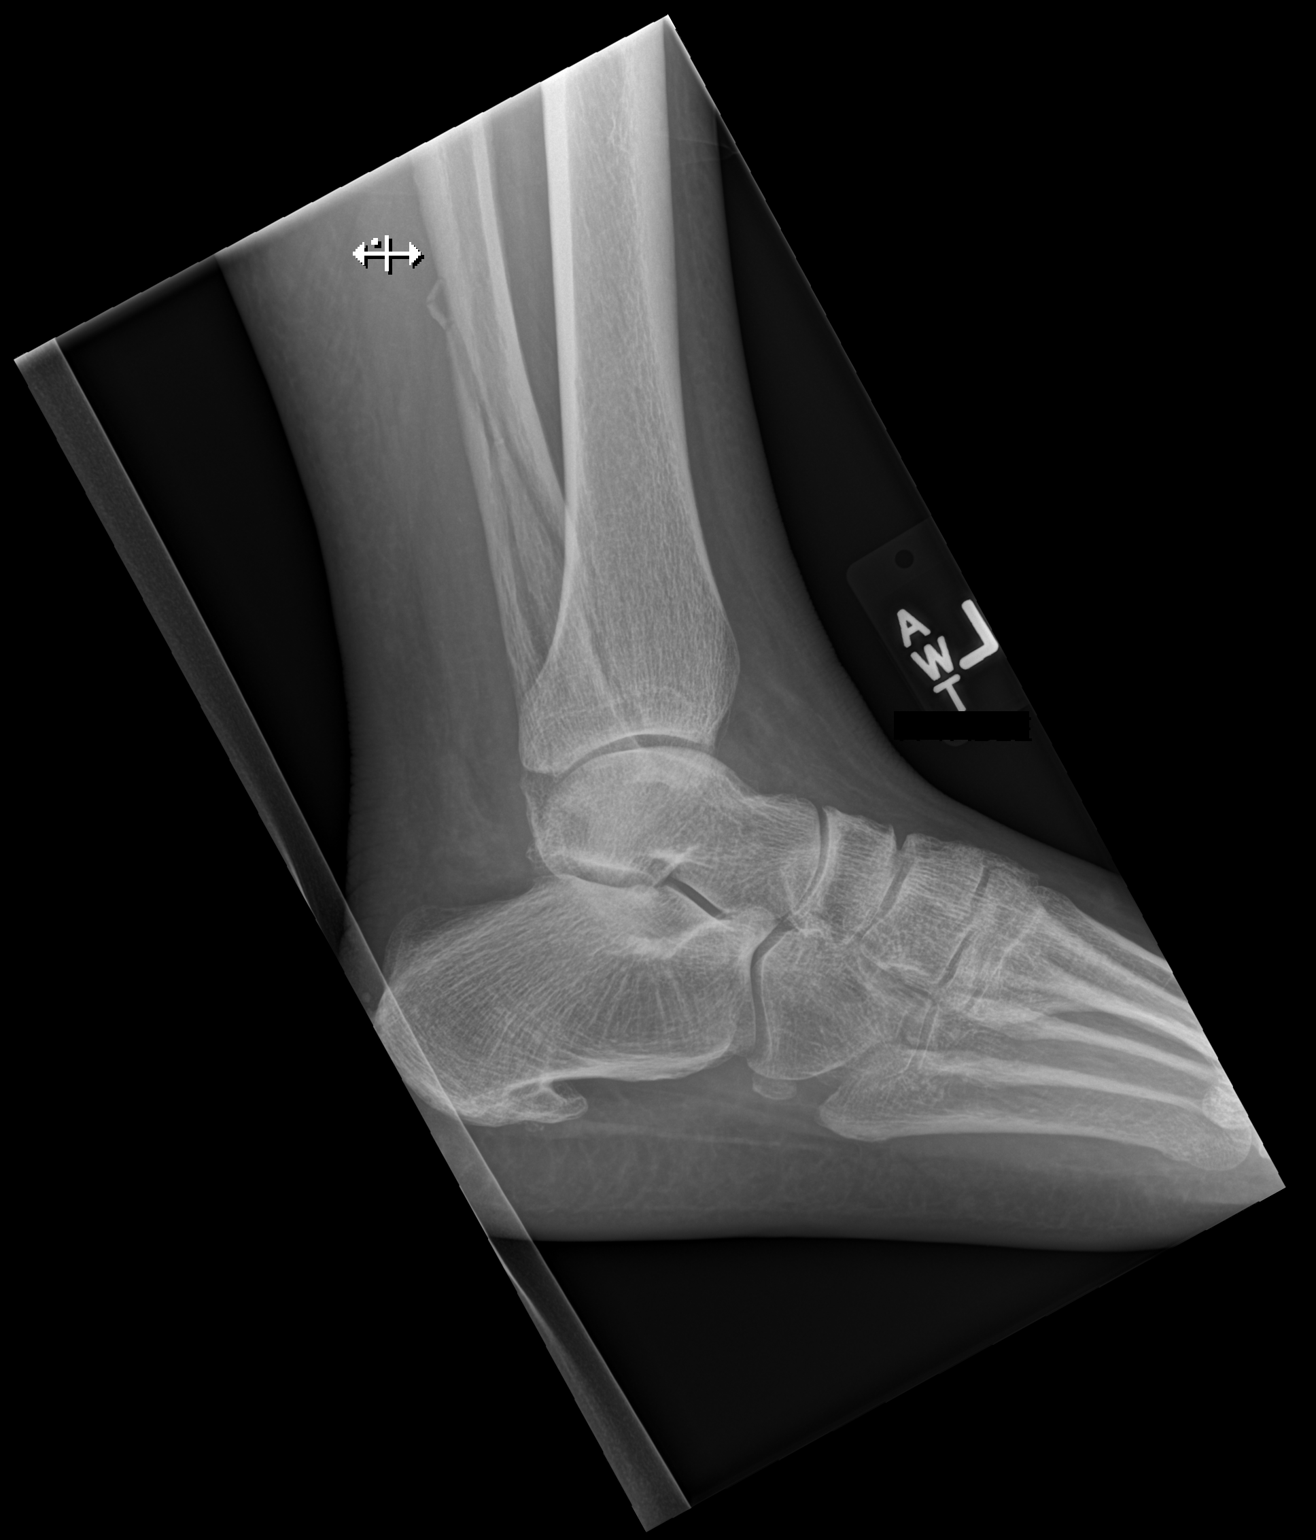

[x ankle ap left]
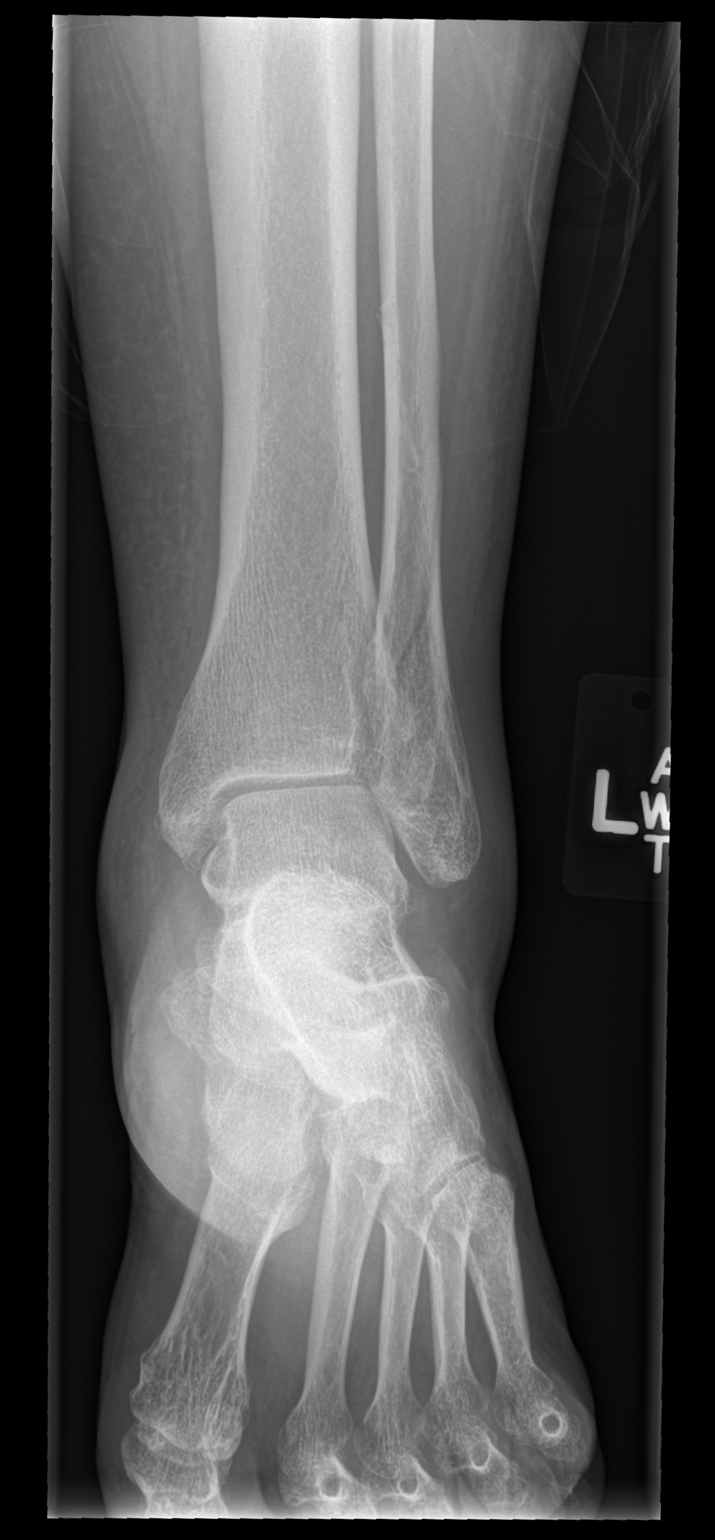

[x ankle obl left]
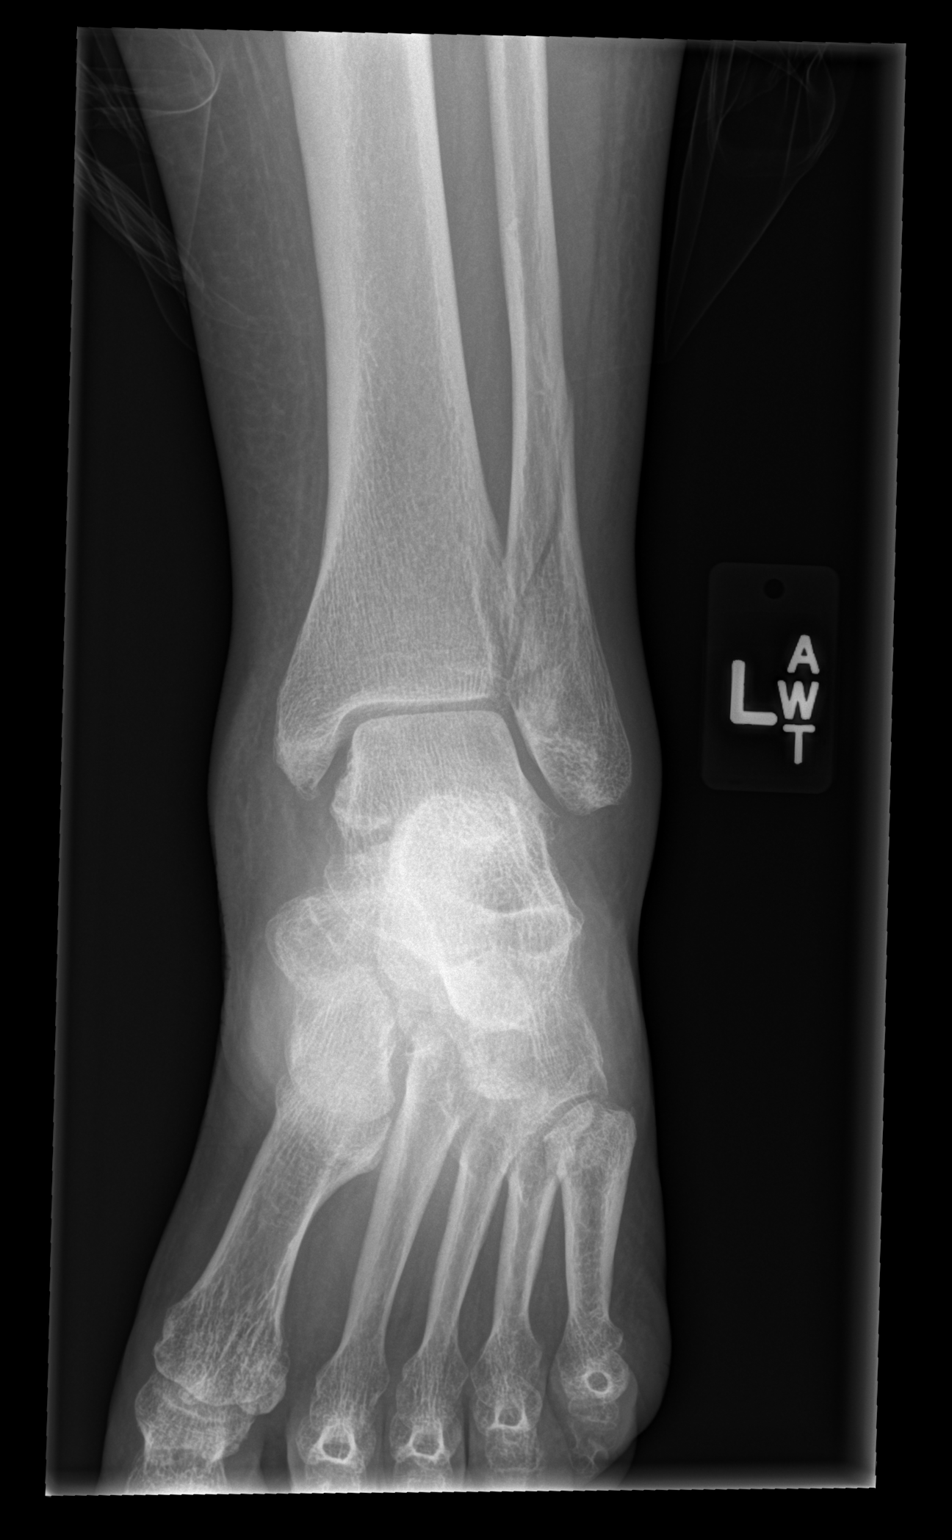

[3 of 3 positions shown; findings below may reference images not displayed]

FINDINGS: Oblique fractures of the distal fibula show mild displacement.
Overlying soft tissue swelling present. The ankle mortise shows
normal alignment.
IMPRESSION: Oblique fractures of the distal fibula demonstrating mild
displacement.

## 2018-02-27 ENCOUNTER — Other Ambulatory Visit: Payer: Self-pay | Admitting: Family Medicine

## 2023-06-12 ENCOUNTER — Other Ambulatory Visit (HOSPITAL_COMMUNITY): Payer: Self-pay

## 2023-06-12 MED ORDER — MOUNJARO 12.5 MG/0.5ML ~~LOC~~ SOAJ
12.5000 mg | SUBCUTANEOUS | 2 refills | Status: DC
  Filled 2023-06-12: qty 2, 28d supply, fill #0

## 2023-07-11 ENCOUNTER — Other Ambulatory Visit: Payer: Self-pay

## 2023-07-12 ENCOUNTER — Other Ambulatory Visit (HOSPITAL_COMMUNITY): Payer: Self-pay

## 2023-07-13 ENCOUNTER — Other Ambulatory Visit (HOSPITAL_COMMUNITY): Payer: Self-pay

## 2023-07-13 MED ORDER — MOUNJARO 12.5 MG/0.5ML ~~LOC~~ SOAJ
12.5000 mg | SUBCUTANEOUS | 2 refills | Status: DC
  Filled 2023-07-13: qty 2, 28d supply, fill #0
  Filled 2023-08-17: qty 2, 28d supply, fill #1
  Filled 2023-09-16: qty 2, 28d supply, fill #2

## 2023-07-16 ENCOUNTER — Other Ambulatory Visit: Payer: Self-pay

## 2023-07-16 ENCOUNTER — Other Ambulatory Visit (HOSPITAL_COMMUNITY): Payer: Self-pay

## 2023-07-17 ENCOUNTER — Other Ambulatory Visit: Payer: Self-pay

## 2023-07-20 ENCOUNTER — Other Ambulatory Visit (HOSPITAL_COMMUNITY): Payer: Self-pay

## 2023-08-19 ENCOUNTER — Other Ambulatory Visit (HOSPITAL_COMMUNITY): Payer: Self-pay

## 2023-08-19 ENCOUNTER — Other Ambulatory Visit: Payer: Self-pay

## 2023-08-20 ENCOUNTER — Other Ambulatory Visit (HOSPITAL_COMMUNITY): Payer: Self-pay

## 2023-09-18 ENCOUNTER — Other Ambulatory Visit (HOSPITAL_COMMUNITY): Payer: Self-pay
